# Patient Record
Sex: Male | Born: 1940 | Race: White | Hispanic: No | Marital: Married
Health system: Southern US, Community
[De-identification: ages and names within clinical notes are randomized; demographics above are authoritative.]

---

## 2004-09-22 ENCOUNTER — Ambulatory Visit: Payer: Self-pay | Admitting: Gastroenterology

## 2006-10-12 ENCOUNTER — Ambulatory Visit: Payer: Self-pay | Admitting: Internal Medicine

## 2006-11-02 ENCOUNTER — Ambulatory Visit: Payer: Self-pay | Admitting: Cardiology

## 2008-08-14 ENCOUNTER — Ambulatory Visit: Payer: Self-pay | Admitting: Gastroenterology

## 2009-10-22 ENCOUNTER — Ambulatory Visit: Payer: Self-pay | Admitting: Specialist

## 2010-03-16 ENCOUNTER — Ambulatory Visit: Payer: Self-pay | Admitting: Internal Medicine

## 2010-03-19 ENCOUNTER — Ambulatory Visit: Payer: Self-pay | Admitting: Internal Medicine

## 2010-04-01 ENCOUNTER — Ambulatory Visit: Payer: Self-pay

## 2010-07-19 ENCOUNTER — Ambulatory Visit: Payer: Self-pay | Admitting: Internal Medicine

## 2010-11-29 ENCOUNTER — Ambulatory Visit: Payer: Self-pay | Admitting: Internal Medicine

## 2011-01-13 ENCOUNTER — Ambulatory Visit: Payer: Self-pay | Admitting: Internal Medicine

## 2011-01-28 ENCOUNTER — Ambulatory Visit: Payer: Self-pay | Admitting: Internal Medicine

## 2011-02-10 ENCOUNTER — Ambulatory Visit: Payer: Self-pay | Admitting: Gastroenterology

## 2011-03-23 ENCOUNTER — Ambulatory Visit: Payer: Self-pay | Admitting: Gastroenterology

## 2011-03-24 ENCOUNTER — Ambulatory Visit: Payer: Self-pay | Admitting: Surgery

## 2011-03-29 ENCOUNTER — Ambulatory Visit: Payer: Self-pay | Admitting: Surgery

## 2011-04-18 ENCOUNTER — Ambulatory Visit: Payer: Self-pay | Admitting: Internal Medicine

## 2011-04-18 LAB — CBC WITH DIFFERENTIAL/PLATELET
Basophil #: 0 10*3/uL (ref 0.0–0.1)
Basophil %: 0.9 %
HCT: 27 % — ABNORMAL LOW (ref 40.0–52.0)
HGB: 9 g/dL — ABNORMAL LOW (ref 13.0–18.0)
Lymphocyte #: 1 10*3/uL (ref 1.0–3.6)
MCH: 30 pg (ref 26.0–34.0)
MCHC: 33.4 g/dL (ref 32.0–36.0)
Monocyte #: 0.2 x10 3/mm (ref 0.2–1.0)
Monocyte %: 9.9 %
Neutrophil %: 34.3 %

## 2011-05-16 ENCOUNTER — Ambulatory Visit: Payer: Self-pay | Admitting: Internal Medicine

## 2011-05-16 LAB — CBC WITH DIFFERENTIAL/PLATELET
Basophil #: 0 10*3/uL (ref 0.0–0.1)
HGB: 8.8 g/dL — ABNORMAL LOW (ref 13.0–18.0)
MCH: 29.6 pg (ref 26.0–34.0)
MCV: 90 fL (ref 80–100)
Monocyte #: 0.3 x10 3/mm (ref 0.2–1.0)
Monocyte %: 8.9 %
Neutrophil #: 2.7 10*3/uL (ref 1.4–6.5)
Neutrophil %: 71 %
Platelet: 170 10*3/uL (ref 150–440)
RDW: 19.9 % — ABNORMAL HIGH (ref 11.5–14.5)
WBC: 3.8 10*3/uL (ref 3.8–10.6)

## 2011-08-03 ENCOUNTER — Ambulatory Visit: Payer: Self-pay | Admitting: Internal Medicine

## 2011-09-13 ENCOUNTER — Ambulatory Visit: Payer: Self-pay | Admitting: Internal Medicine

## 2011-10-03 ENCOUNTER — Ambulatory Visit: Payer: Self-pay | Admitting: Internal Medicine

## 2011-10-03 LAB — CBC WITH DIFFERENTIAL/PLATELET
Basophil %: 0.5 %
Eosinophil #: 0 10*3/uL (ref 0.0–0.7)
Eosinophil %: 0.3 %
HGB: 7.6 g/dL — ABNORMAL LOW (ref 13.0–18.0)
Lymphocyte #: 1.3 10*3/uL (ref 1.0–3.6)
MCH: 31.7 pg (ref 26.0–34.0)
MCHC: 32.7 g/dL (ref 32.0–36.0)
MCV: 97 fL (ref 80–100)
Monocyte #: 0.3 x10 3/mm (ref 0.2–1.0)
Neutrophil #: 4.6 10*3/uL (ref 1.4–6.5)
Platelet: 114 10*3/uL — ABNORMAL LOW (ref 150–440)
RBC: 2.39 10*6/uL — ABNORMAL LOW (ref 4.40–5.90)

## 2011-10-03 LAB — COMPREHENSIVE METABOLIC PANEL
Anion Gap: 10 (ref 7–16)
BUN: 21 mg/dL — ABNORMAL HIGH (ref 7–18)
Calcium, Total: 8.3 mg/dL — ABNORMAL LOW (ref 8.5–10.1)
Chloride: 109 mmol/L — ABNORMAL HIGH (ref 98–107)
Co2: 25 mmol/L (ref 21–32)
Creatinine: 0.91 mg/dL (ref 0.60–1.30)
EGFR (African American): >60
EGFR (Non-African Amer.): >60
SGOT(AST): 14 U/L — ABNORMAL LOW (ref 15–37)
SGPT (ALT): 14 U/L (ref 12–78)
Total Protein: 6.1 g/dL — ABNORMAL LOW (ref 6.4–8.2)

## 2011-12-23 ENCOUNTER — Ambulatory Visit: Payer: Self-pay | Admitting: Physician Assistant

## 2012-01-09 ENCOUNTER — Ambulatory Visit: Payer: Self-pay | Admitting: Internal Medicine

## 2012-03-19 ENCOUNTER — Ambulatory Visit: Payer: Self-pay | Admitting: Internal Medicine

## 2012-04-03 DEATH — deceased

## 2013-03-22 IMAGING — CR RIGHT HIP - COMPLETE 2+ VIEW
1 series · 2 of 2 positions shown · non-contrast
Comparison: none

REASON FOR EXAM: pain, prostate CA, R/O fracture
COMMENTS:

[Series 1: view not recorded · 0.17mm/px · 2 of 2 slices shown]
[im 1/2]
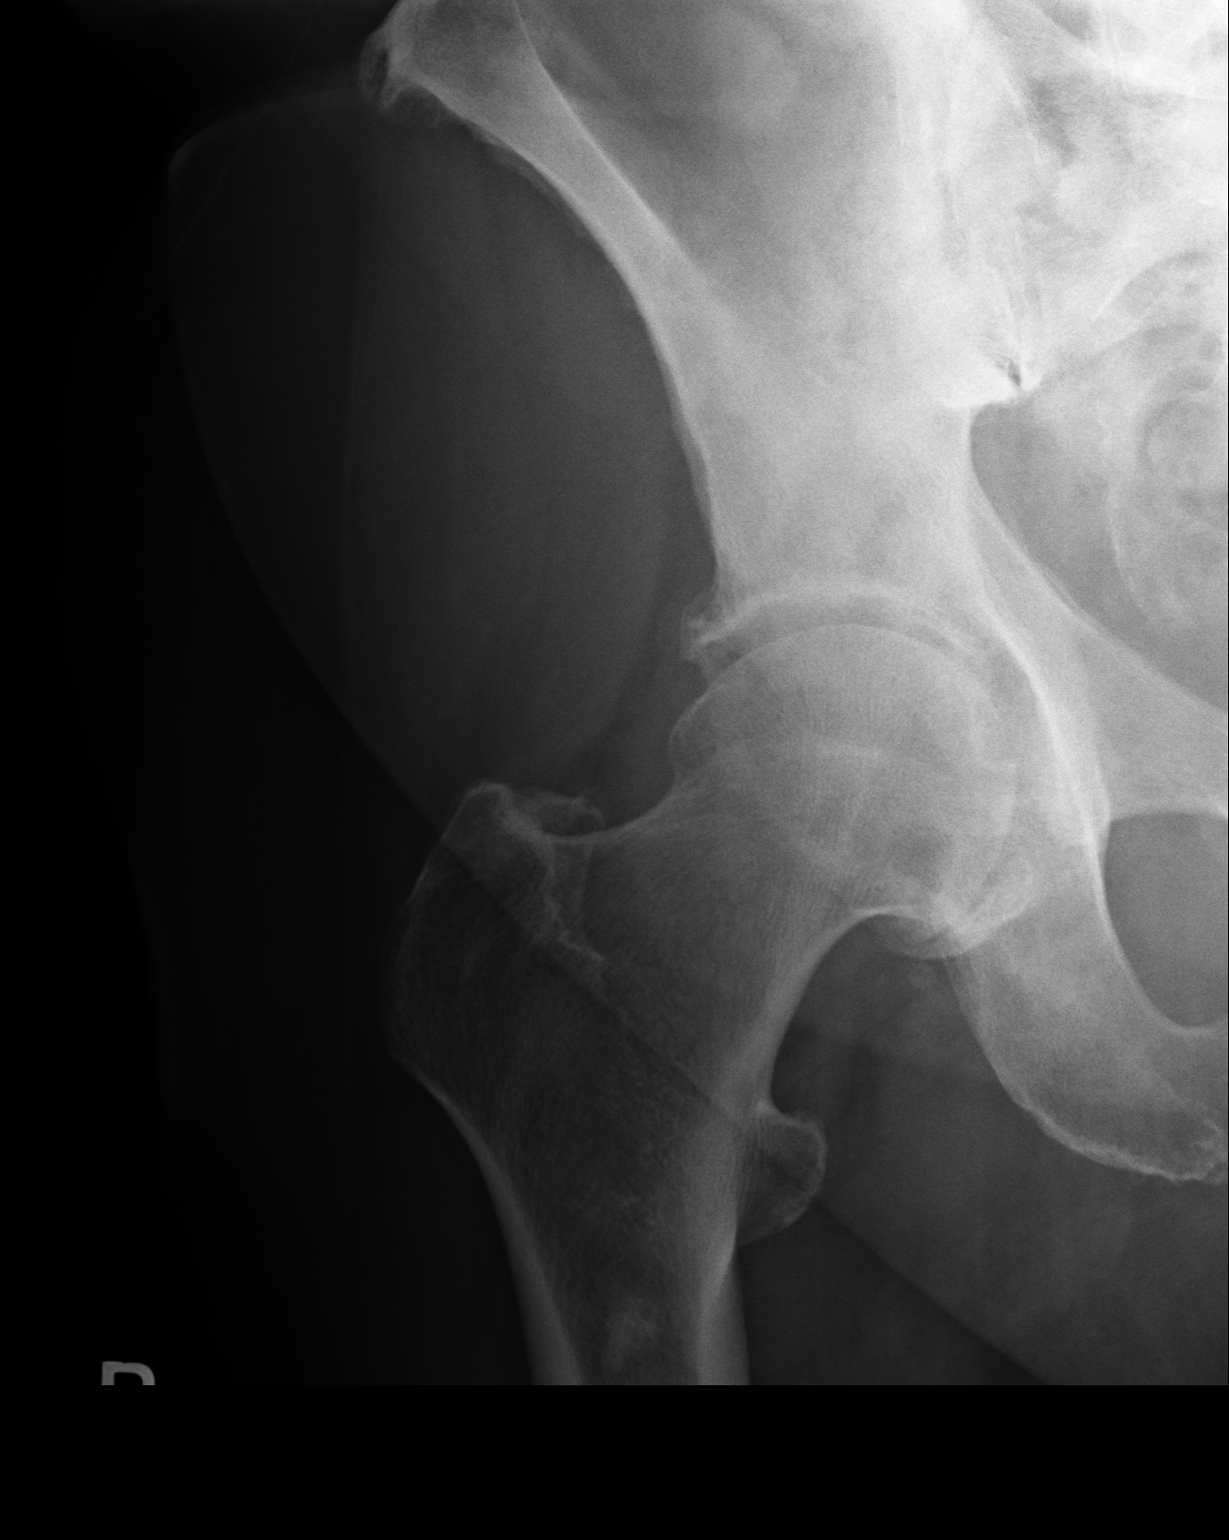
[im 2/2]
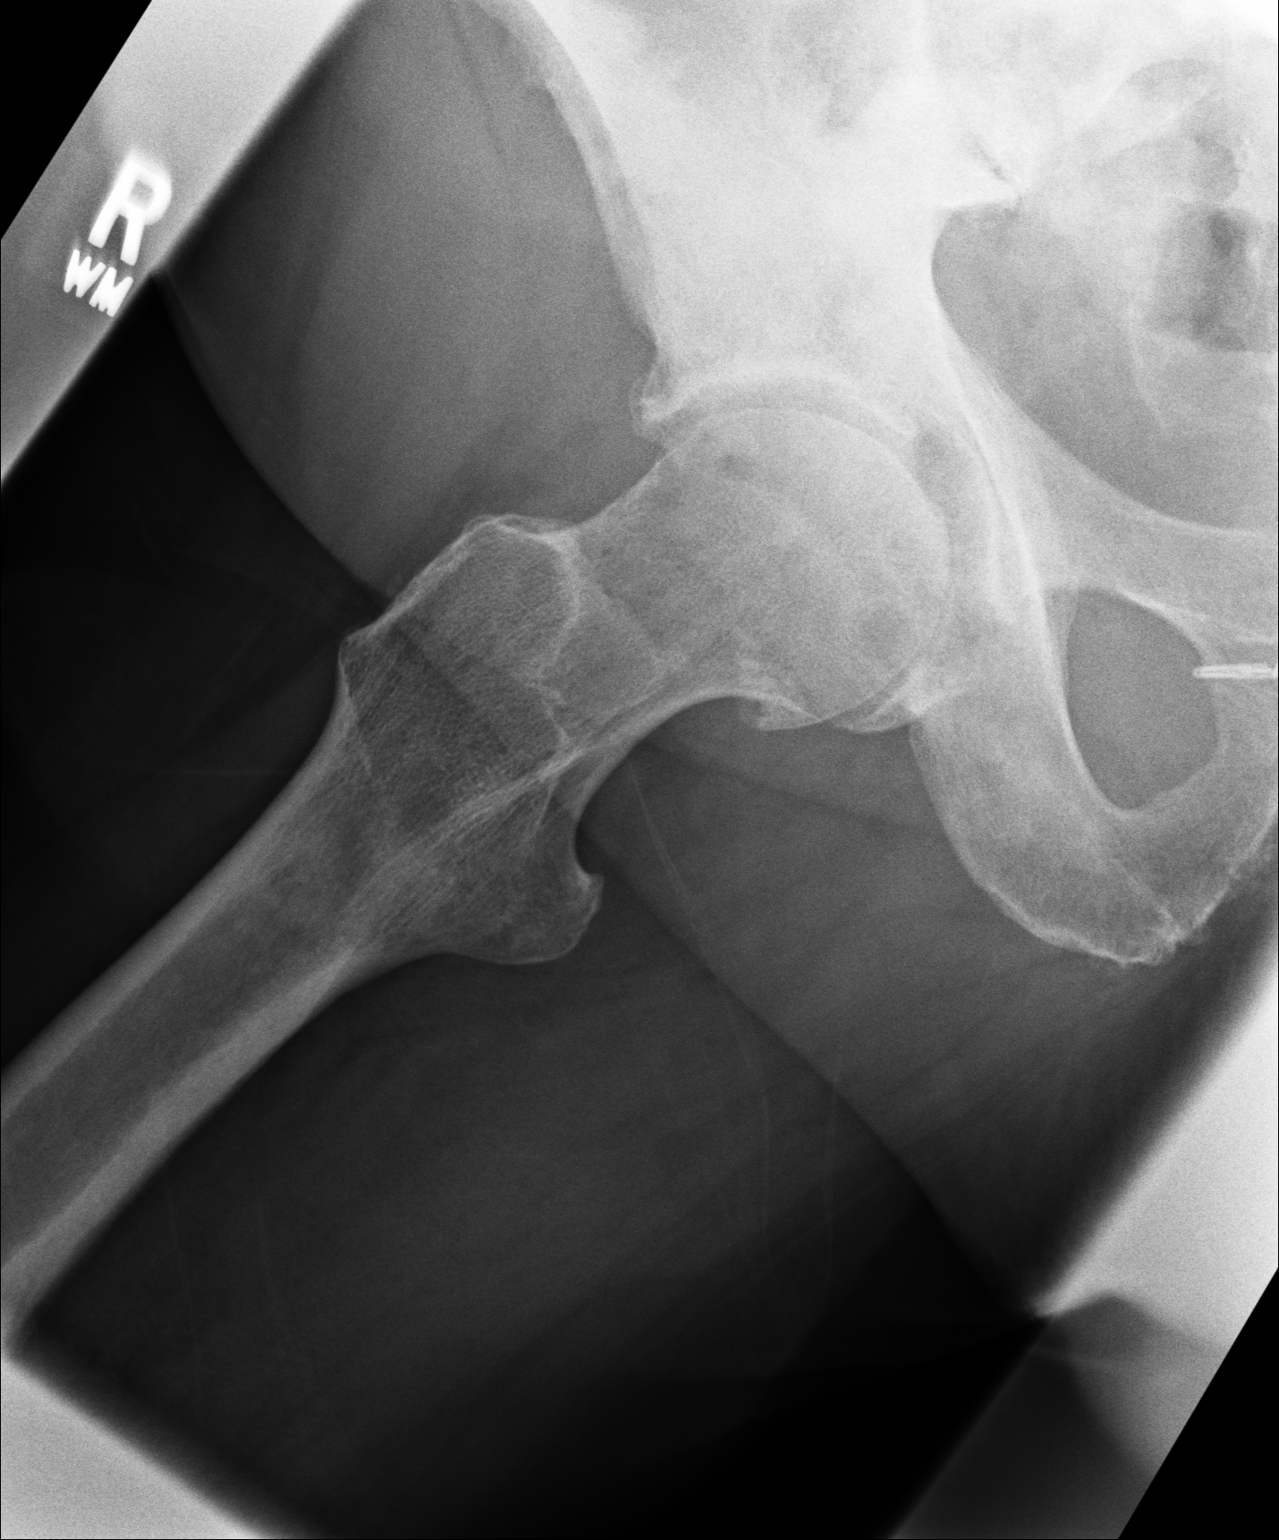

[2 of 2 positions shown; findings below may reference images not displayed]

PROCEDURE:     KDR - KDXR HIP RIGHT COMPLETE  - July 19, 2010  [DATE]

RESULT:     There is no evidence of acute fracture, dislocation or
malalignment. Osteoarthritic changes are evident demonstrated by regions of
osteophytosis. A sclerotic density projects within the proximal femoral
shaft. This may represent the sequela of a bone island though more ominous
etiologies cannot be excluded if clinically appropriate. There is no
evidence of fracture or dislocation.
IMPRESSION: 1. Osteoarthritic changes as described above.
2. Sclerotic density proximal femur as described above.

## 2013-03-22 IMAGING — CR DG FEMUR 2V*R*
1 series · 4 of 4 positions shown · non-contrast
Comparison: none

REASON FOR EXAM: pain, prostate CA, R/O fracture
COMMENTS:

[Series 1: view not recorded · 0.17mm/px · 4 of 4 slices shown]
[im 1/4]
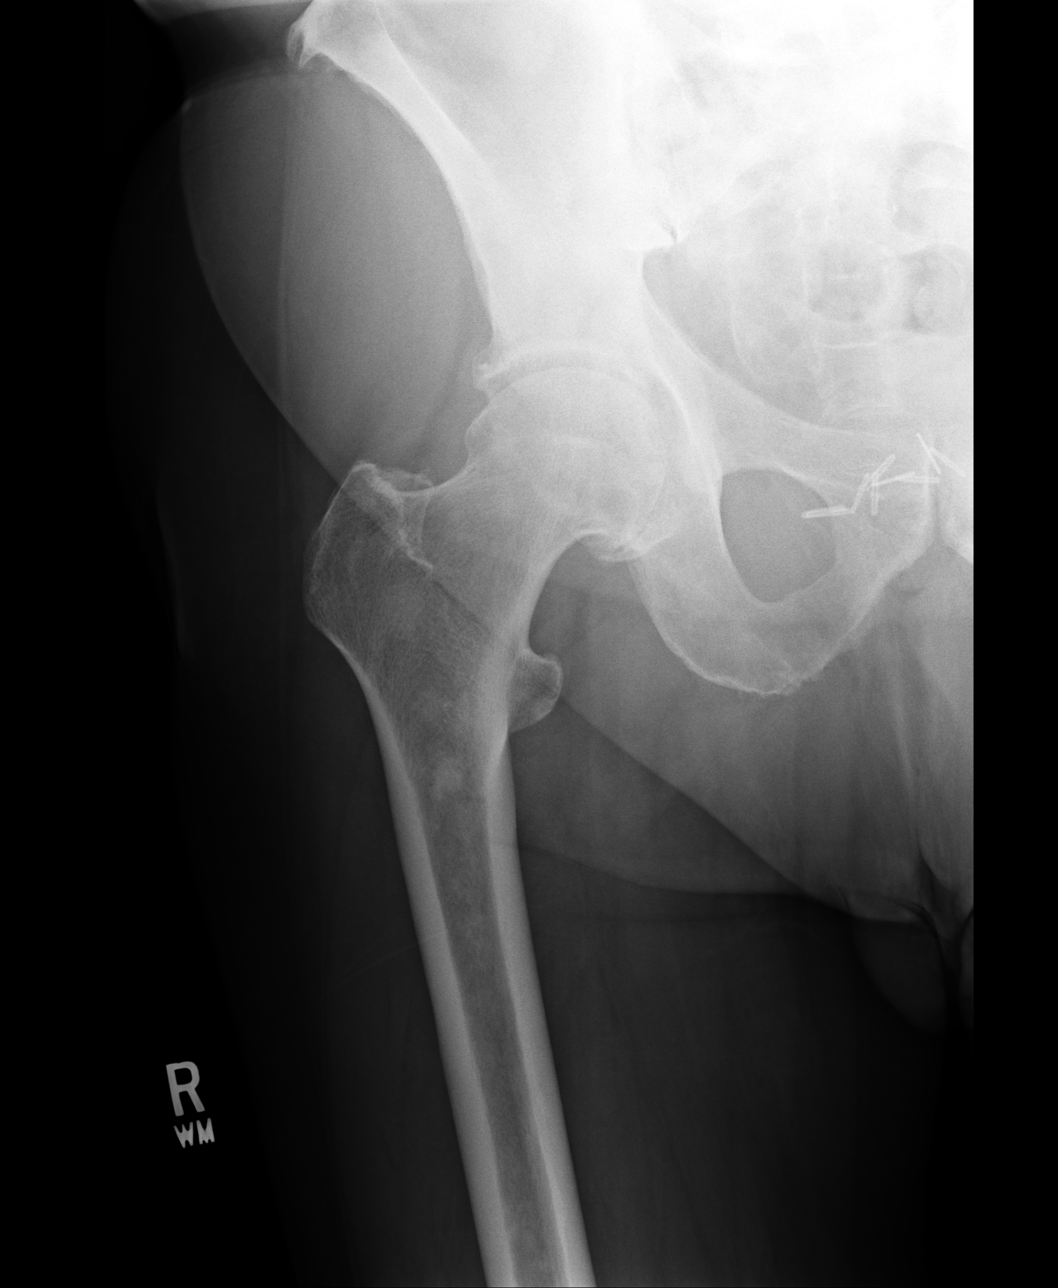
[im 2/4]
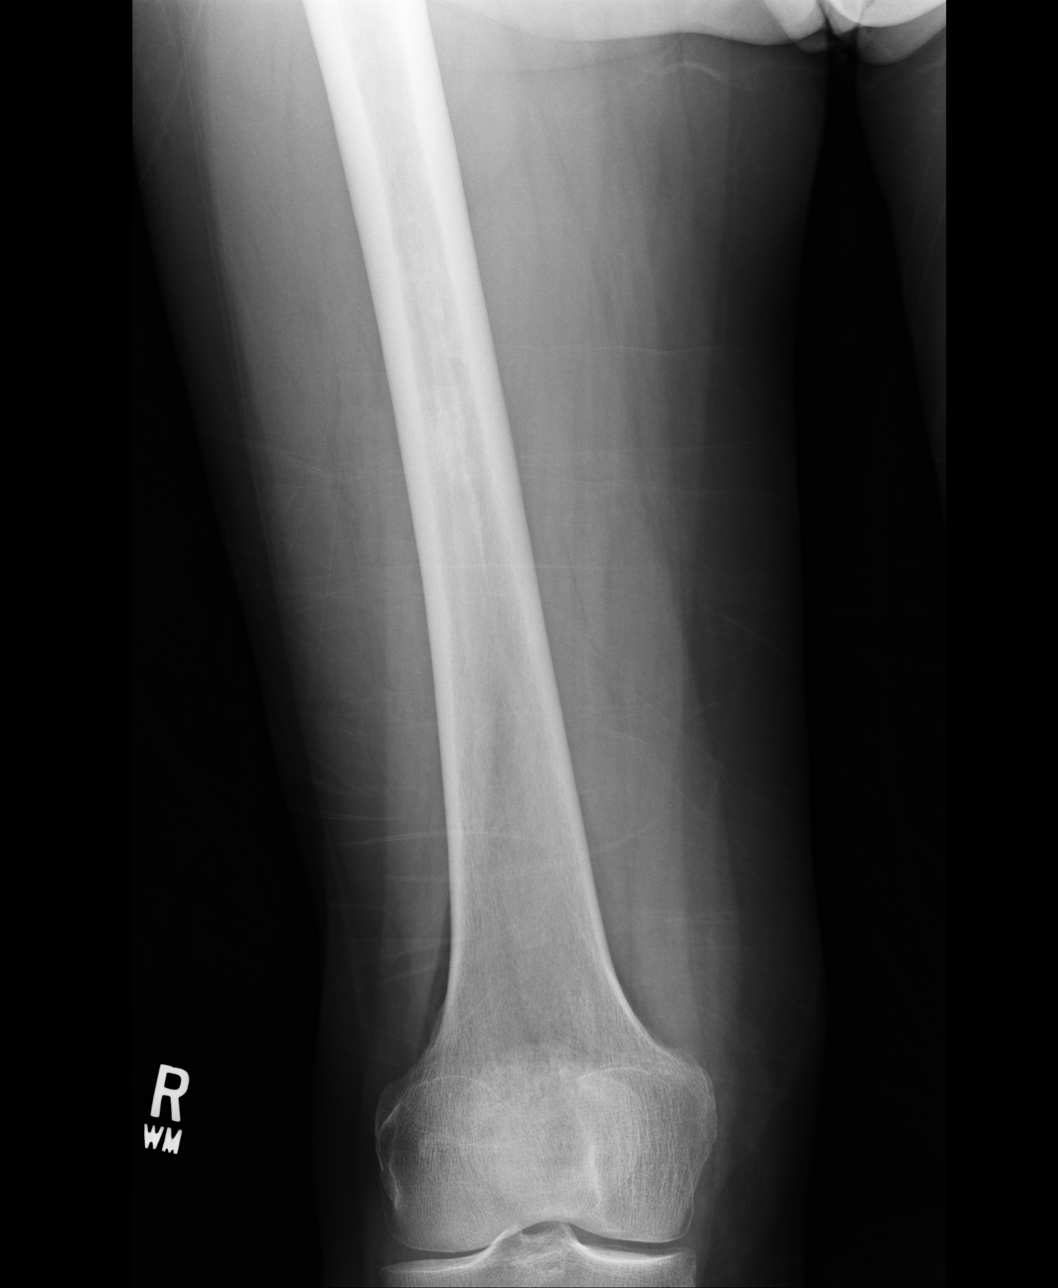
[im 3/4]
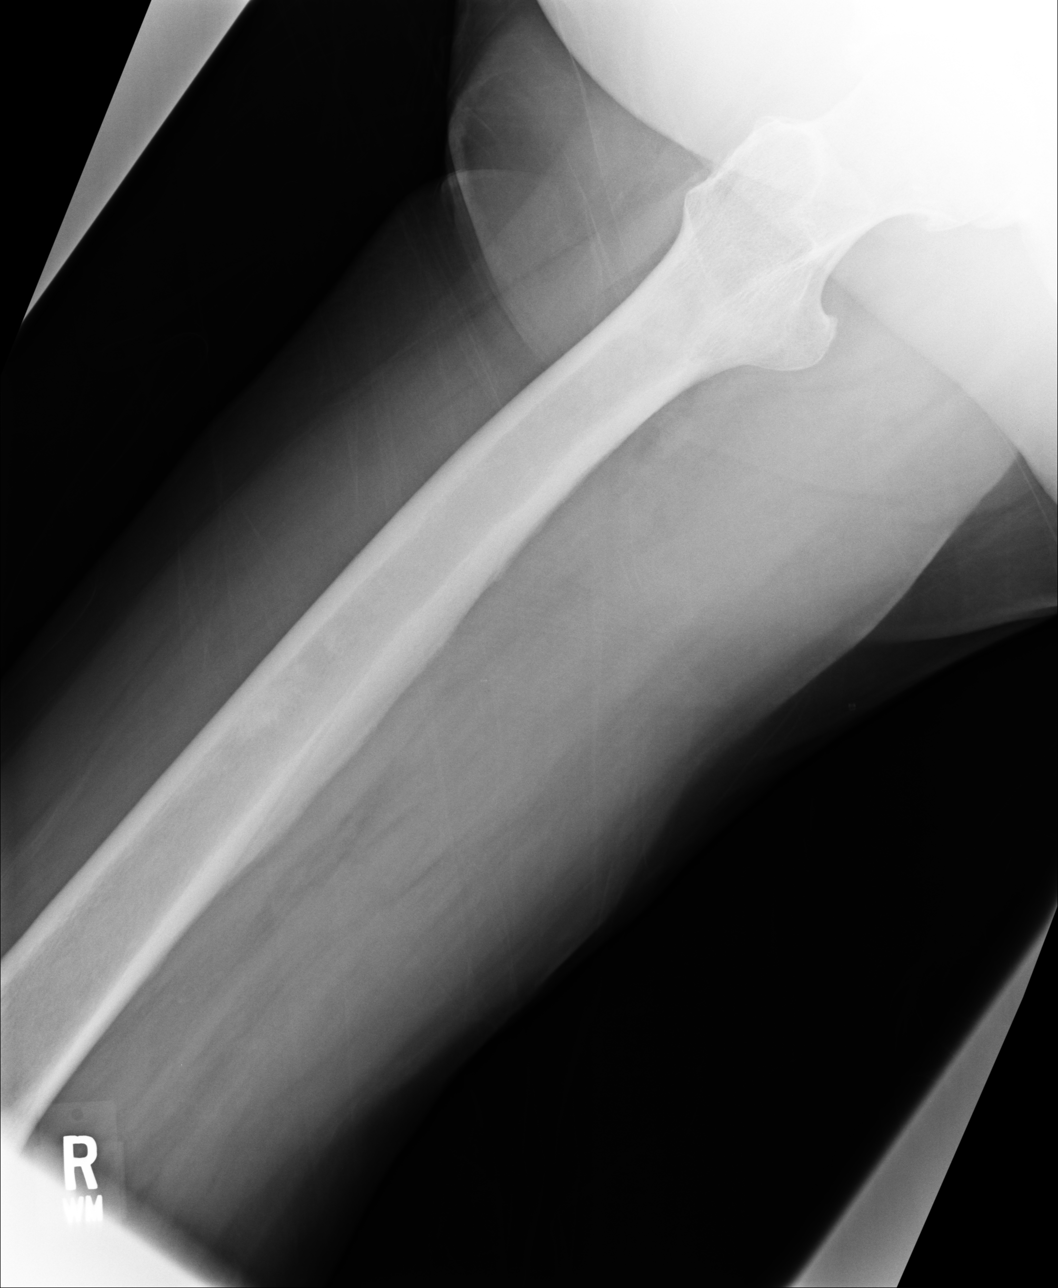
[im 4/4]
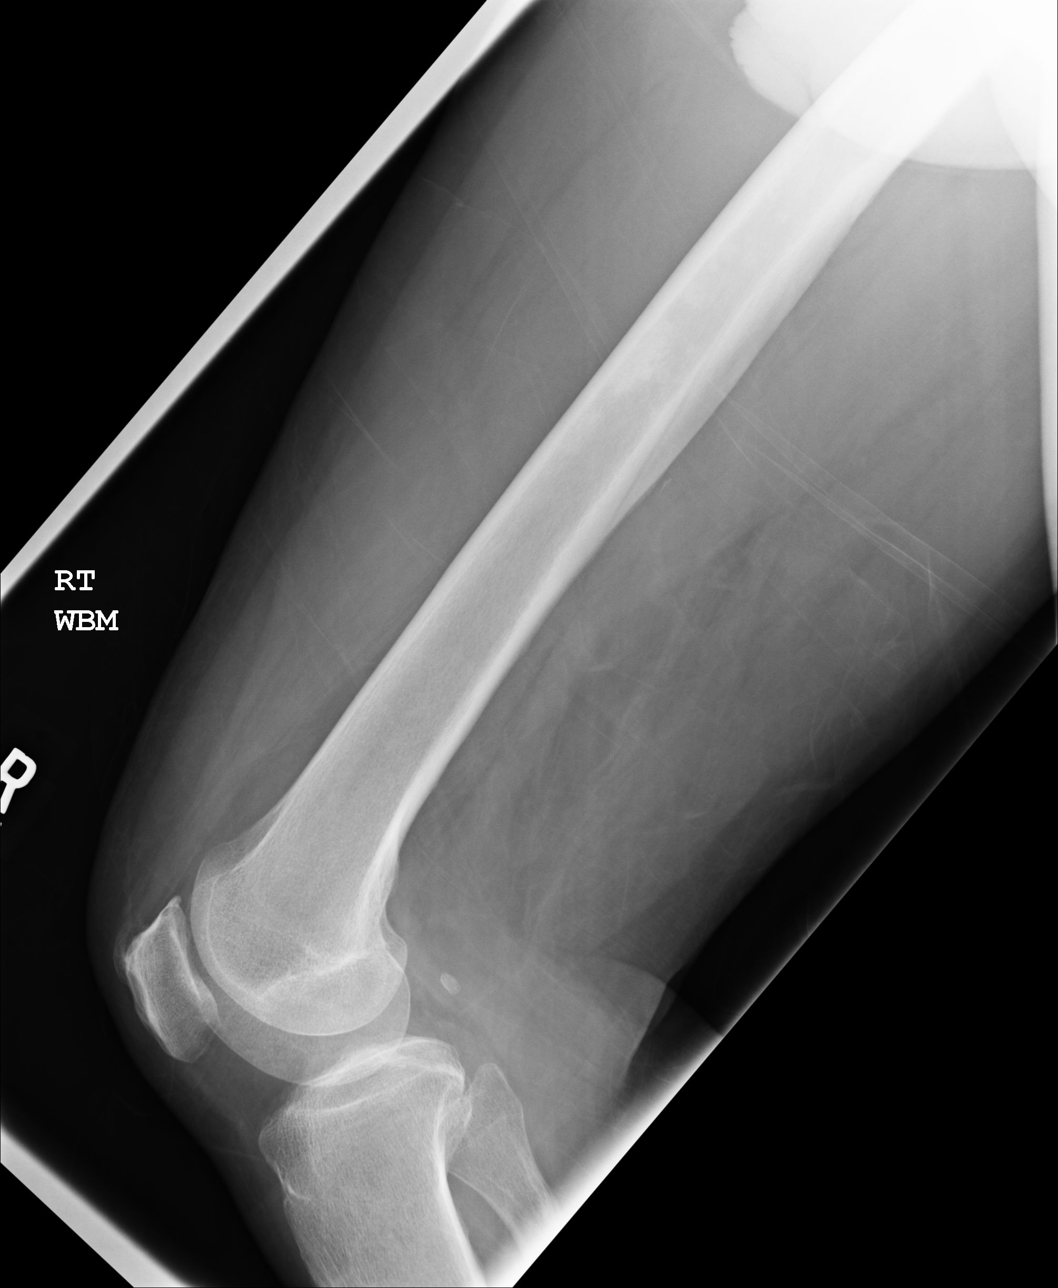

[4 of 4 positions shown; findings below may reference images not displayed]

PROCEDURE:     KDR - KDXR FEMUR RIGHT  - July 19, 2010  [DATE]

RESULT:     There is no radiographic evidence of fracture or dislocation.
Within the mid femoral shaft, ill-defined amorphous appearing areas of
increased density project within the medullary canal. These findings are
also appreciated proximally within the femur though to a lesser extent.
IMPRESSION: These findings are concerning for a marrow infiltrating process e.g.
metastatic disease. Clinical correlation recommended. If clinically
warranted, further evaluation with MRI is recommended and/or nuclear
medicine bone scanning.

## 2013-06-11 IMAGING — US US EXTREM LOW VENOUS BILAT
1 series · 14 of 24 positions shown · non-contrast
Comparison: none

REASON FOR EXAM: CALL REPORT [id number] Bilateral leg pain and swelling
Eval for DVT
COMMENTS:

[Series 1: us extrem low venous bilat · 0.09mm/px · 14 of 39 slices shown]
[im 1/39]
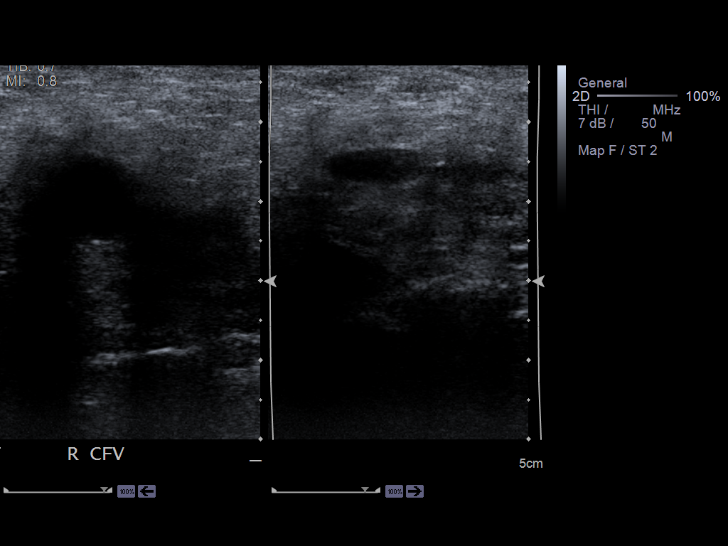
[im 4/39]
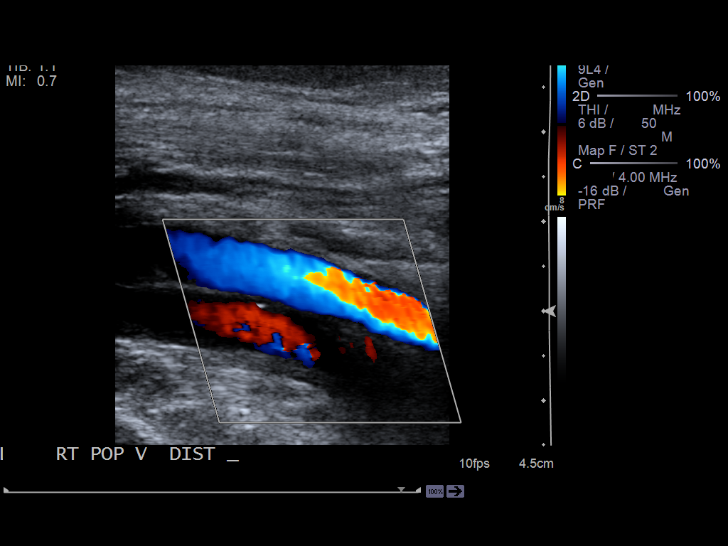
[im 7/39]
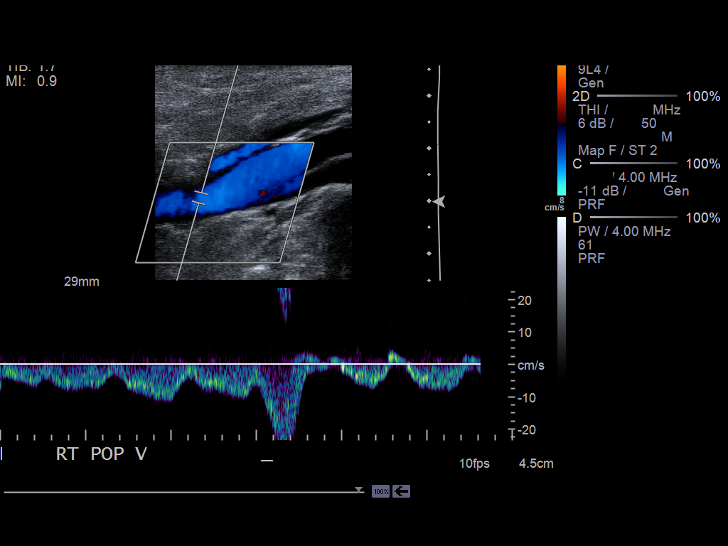
[im 10/39]
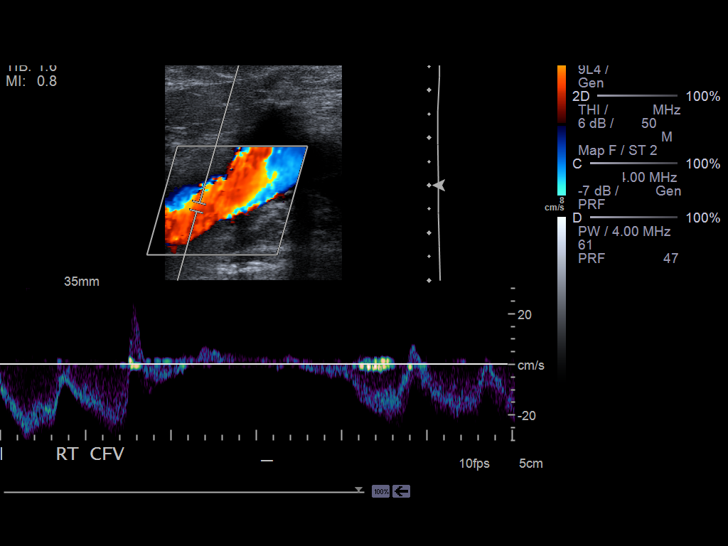
[im 12/39]
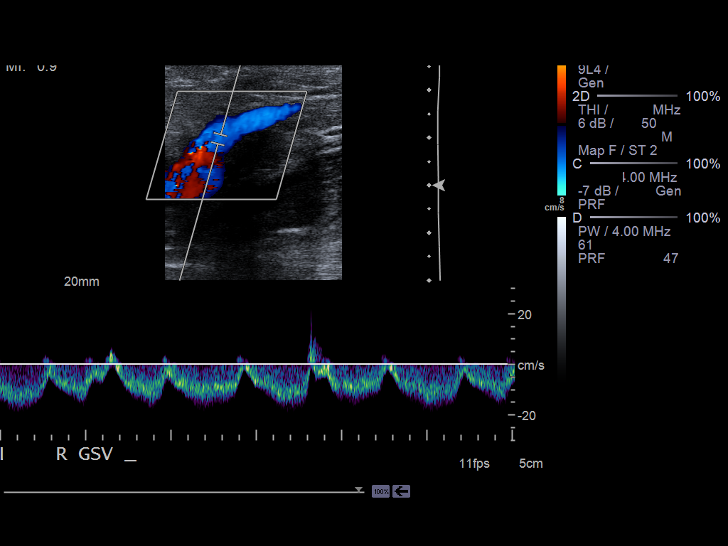
[im 15/39]
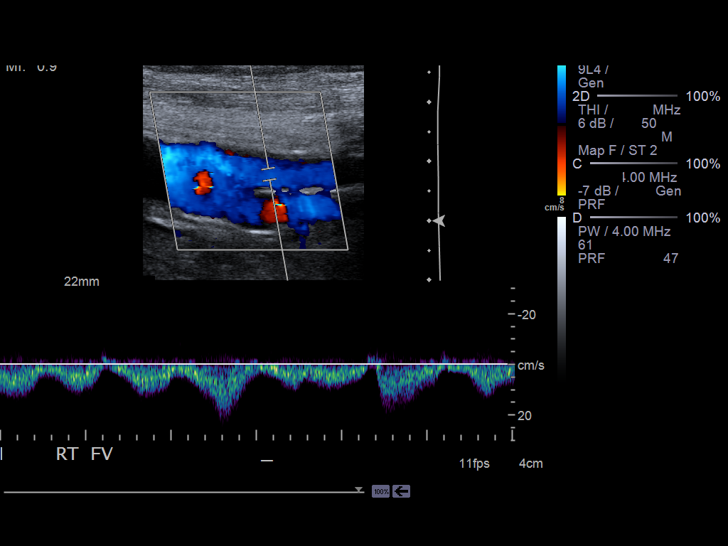
[im 19/39]
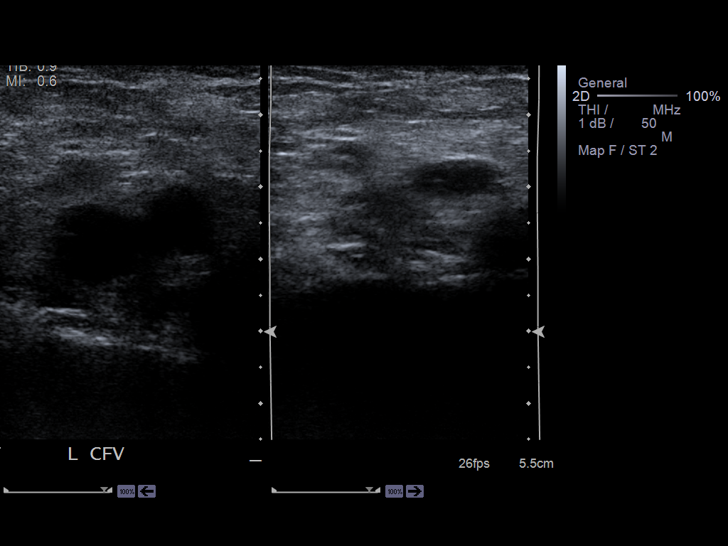
[im 20/39]
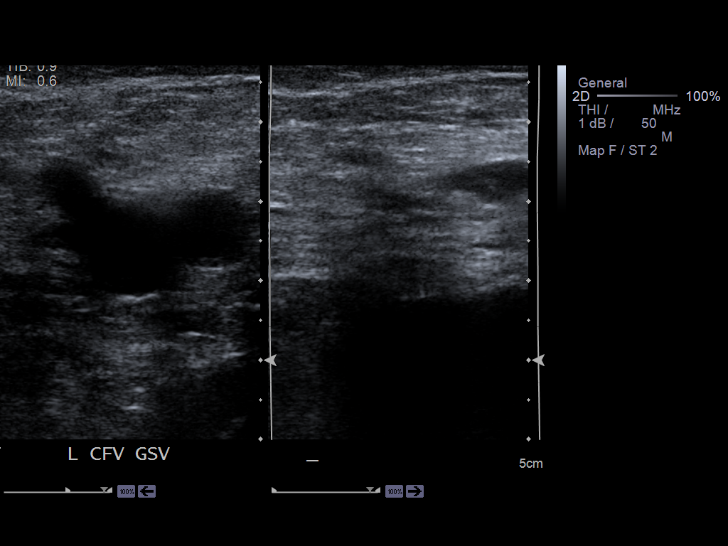
[im 24/39]
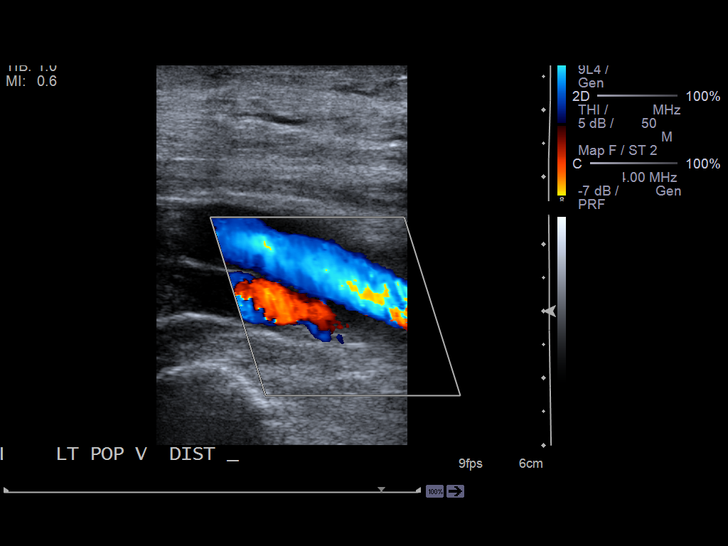
[im 27/39]
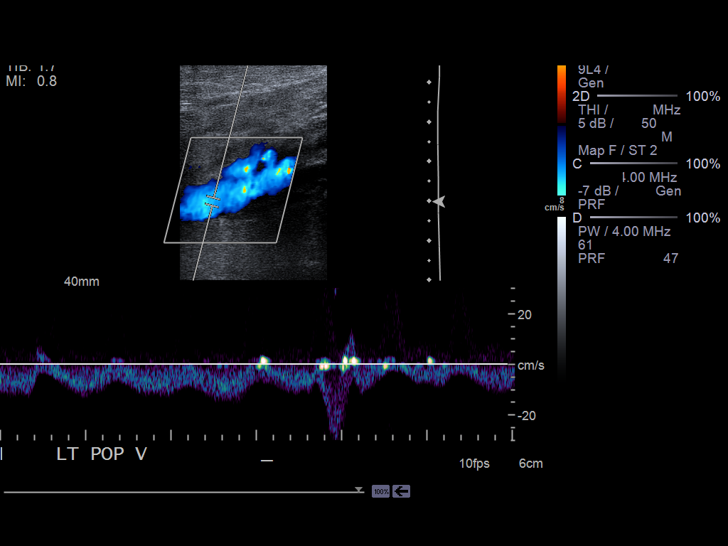
[im 30/39]
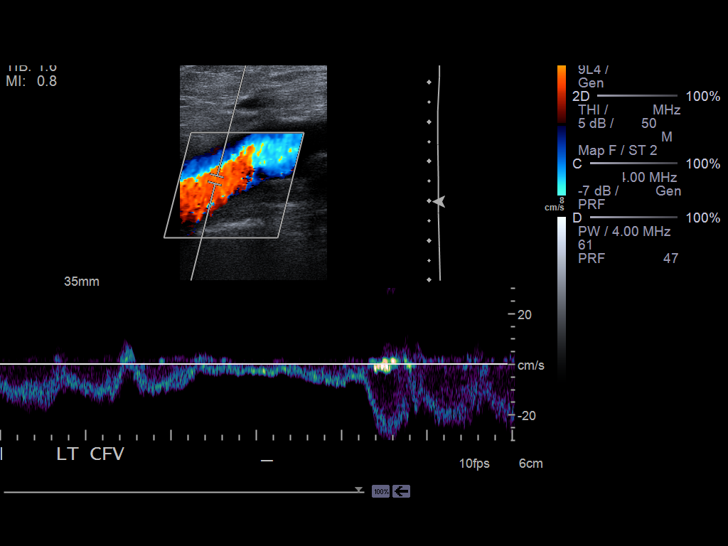
[im 32/39]
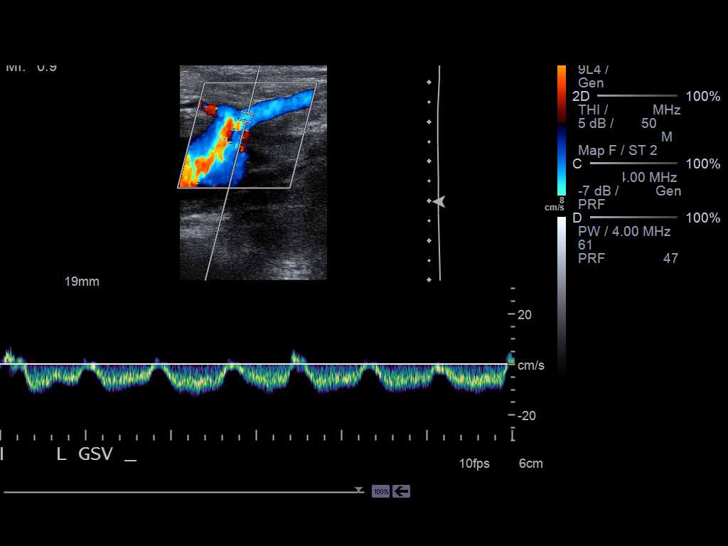
[im 35/39]
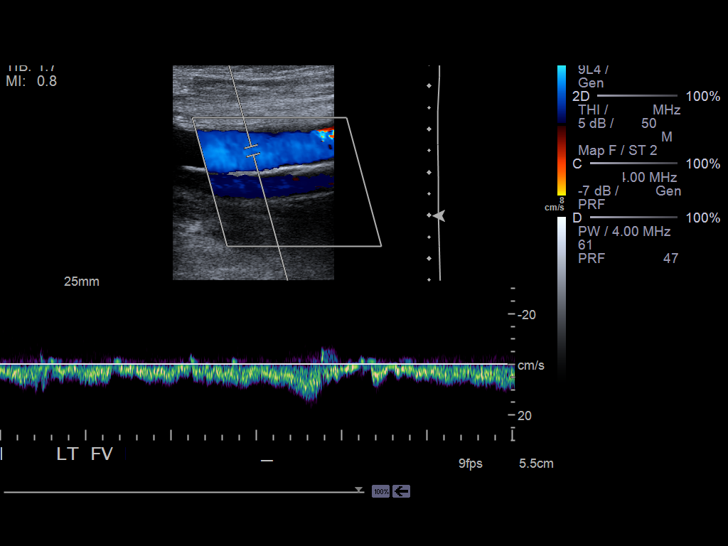
[im 39/39]
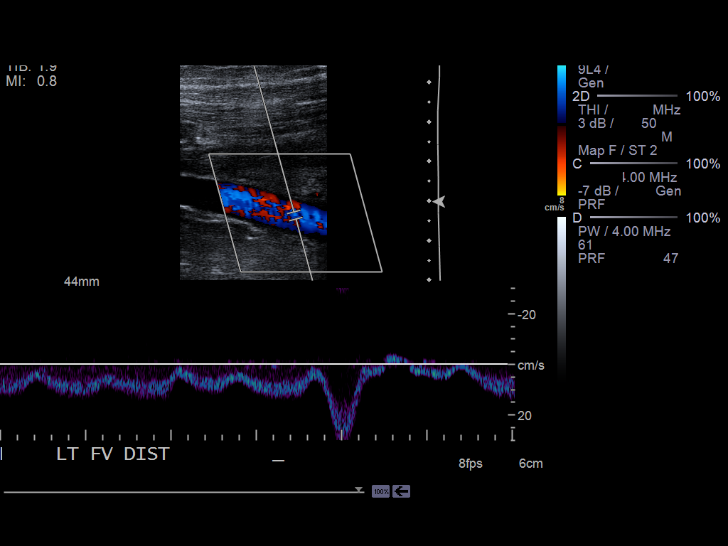

[14 of 24 positions shown; findings below may reference images not displayed]

PROCEDURE:     US  - US DOPPLER LOW EXTR BILATERAL  - January 09, 2012  [DATE]

RESULT:     Grayscale and color flow Doppler techniques were employed to
evaluate the deep venous systems of the right and left lower extremities.

The common femoral veins, superficial femoral veins, and popliteal veins are
normally compressible bilaterally. The waveform patterns are normal and the
color flow images are normal. The response to the augmentation and Valsalva
maneuvers is normal.
IMPRESSION: There is no evidence of thrombus within the right or left
femoral or popliteal veins.

[REDACTED]

## 2014-04-27 NOTE — Op Note (Signed)
PATIENT NAME:  James Middleton, [name] MR#:  [id number] DATE OF BIRTH:  1940/03/29  DATE OF PROCEDURE:  03/29/2011  PREOPERATIVE DIAGNOSIS: Metastatic prostate cancer.   POSTOPERATIVE DIAGNOSIS: Metastatic prostate cancer.   PROCEDURE: Port-A-Cath placement.   SURGEON:  Dr. Michela PitcherEly  ANESTHESIA: Monitored anesthetic care.   OPERATIVE PROCEDURE: With the patient in the supine position after induction of appropriate intravenous sedation, the patient's right chest and neck were prepped with ChloraPrep and draped with sterile towels. 1% Xylocaine buffered with sodium bicarbonate was injected just under the clavicle on the right side. This area was incised and carried down through the subcutaneous tissue with Bovie electrocautery. The subclavian vein was cannulated in a single pass and a wire passed into the right heart under fluoroscopic control. A second incision was made on the chest wall in a similarly anesthetized area with subcutaneous suprafascial pocket created for the Port-A-Cath device. Vein dilator and introducer were inserted over the wire and the wire and dilator removed.  A heparin-filled catheter was inserted through the introducer into the right heart and then appropriately positioned in the great vessel system using contrast. It was then tunneled to the second incision where it was attached to a heparin-filled Port-A-Cath device. Again, contrast was used to identify the proper positioning and confirm the absence of kinks or leaks. It was then filled with heparinized saline.  It was sutured to the chest wall using 3-0 silk. The subcutaneous space was obliterated with 3-0 Vicryl and the skin was closed with a running vertical mattress suture of 4-0 nylon. Sterile dressings were applied. The patient was returned to the recovery room having tolerated the procedure well. Sponge, instrument, and needle counts were correct times two in the operating room .    ____________________________ Quentin Orealph L. Ely  III, MD rle:bjt D: 03/29/2011 08:01:29 ET T: 03/29/2011 11:18:30 ET JOB#: [id number] Quentin OreALPH L ELY MD ELECTRONICALLY SIGNED 03/29/2011 12:20
